# Patient Record
Sex: Female | Born: 1955 | Race: White | Hispanic: No | Marital: Married | Smoking: Never smoker
Health system: Southern US, Community
[De-identification: ages and names within clinical notes are randomized; demographics above are authoritative.]

## PROBLEM LIST (undated history)

## (undated) HISTORY — PX: ABDOMINAL HYSTERECTOMY: SHX81

---

## 1998-01-09 ENCOUNTER — Other Ambulatory Visit: Admission: RE | Admit: 1998-01-09 | Discharge: 1998-01-09 | Payer: Self-pay | Admitting: Obstetrics & Gynecology

## 1999-02-11 ENCOUNTER — Other Ambulatory Visit: Admission: RE | Admit: 1999-02-11 | Discharge: 1999-02-11 | Payer: Self-pay | Admitting: Obstetrics & Gynecology

## 2001-05-04 ENCOUNTER — Other Ambulatory Visit: Admission: RE | Admit: 2001-05-04 | Discharge: 2001-05-04 | Payer: Self-pay | Admitting: Obstetrics & Gynecology

## 2005-04-20 ENCOUNTER — Other Ambulatory Visit: Admission: RE | Admit: 2005-04-20 | Discharge: 2005-04-20 | Payer: Self-pay | Admitting: Obstetrics & Gynecology

## 2008-05-18 ENCOUNTER — Encounter: Admission: RE | Admit: 2008-05-18 | Discharge: 2008-05-18 | Payer: Self-pay | Admitting: Obstetrics & Gynecology

## 2010-08-05 ENCOUNTER — Other Ambulatory Visit: Payer: Self-pay | Admitting: Obstetrics & Gynecology

## 2010-08-05 DIAGNOSIS — R928 Other abnormal and inconclusive findings on diagnostic imaging of breast: Secondary | ICD-10-CM

## 2010-08-13 ENCOUNTER — Ambulatory Visit
Admission: RE | Admit: 2010-08-13 | Discharge: 2010-08-13 | Disposition: A | Discharge status: Home / Self Care | Payer: BC Managed Care – PPO | Source: Ambulatory Visit | Attending: Obstetrics & Gynecology | Admitting: Obstetrics & Gynecology

## 2010-08-13 DIAGNOSIS — R928 Other abnormal and inconclusive findings on diagnostic imaging of breast: Secondary | ICD-10-CM

## 2017-02-16 ENCOUNTER — Other Ambulatory Visit: Payer: Self-pay | Admitting: Obstetrics & Gynecology

## 2017-02-16 DIAGNOSIS — R928 Other abnormal and inconclusive findings on diagnostic imaging of breast: Secondary | ICD-10-CM

## 2017-02-19 ENCOUNTER — Other Ambulatory Visit: Payer: Self-pay | Admitting: Obstetrics & Gynecology

## 2017-02-19 ENCOUNTER — Ambulatory Visit
Admission: RE | Admit: 2017-02-19 | Discharge: 2017-02-19 | Disposition: A | Discharge status: Home / Self Care | Payer: BC Managed Care – PPO | Source: Ambulatory Visit | Attending: Obstetrics & Gynecology | Admitting: Obstetrics & Gynecology

## 2017-02-19 DIAGNOSIS — R928 Other abnormal and inconclusive findings on diagnostic imaging of breast: Secondary | ICD-10-CM

## 2017-02-19 DIAGNOSIS — N6320 Unspecified lump in the left breast, unspecified quadrant: Secondary | ICD-10-CM

## 2017-08-24 ENCOUNTER — Other Ambulatory Visit: Payer: BC Managed Care – PPO

## 2017-09-01 ENCOUNTER — Ambulatory Visit
Admission: RE | Admit: 2017-09-01 | Discharge: 2017-09-01 | Disposition: A | Discharge status: Home / Self Care | Payer: BC Managed Care – PPO | Source: Ambulatory Visit | Attending: Obstetrics & Gynecology | Admitting: Obstetrics & Gynecology

## 2017-09-01 ENCOUNTER — Other Ambulatory Visit: Payer: Self-pay | Admitting: Obstetrics & Gynecology

## 2017-09-01 DIAGNOSIS — N6320 Unspecified lump in the left breast, unspecified quadrant: Secondary | ICD-10-CM

## 2017-11-17 ENCOUNTER — Emergency Department (HOSPITAL_BASED_OUTPATIENT_CLINIC_OR_DEPARTMENT_OTHER): Payer: BC Managed Care – PPO

## 2017-11-17 ENCOUNTER — Encounter (HOSPITAL_BASED_OUTPATIENT_CLINIC_OR_DEPARTMENT_OTHER): Payer: Self-pay

## 2017-11-17 ENCOUNTER — Emergency Department (HOSPITAL_BASED_OUTPATIENT_CLINIC_OR_DEPARTMENT_OTHER)
Admission: EM | Admit: 2017-11-17 | Discharge: 2017-11-17 | Disposition: A | Discharge status: Home / Self Care | Payer: BC Managed Care – PPO | Attending: Emergency Medicine | Admitting: Emergency Medicine

## 2017-11-17 ENCOUNTER — Other Ambulatory Visit: Payer: Self-pay

## 2017-11-17 DIAGNOSIS — S8991XA Unspecified injury of right lower leg, initial encounter: Secondary | ICD-10-CM | POA: Diagnosis not present

## 2017-11-17 DIAGNOSIS — M25561 Pain in right knee: Secondary | ICD-10-CM

## 2017-11-17 DIAGNOSIS — S0990XA Unspecified injury of head, initial encounter: Secondary | ICD-10-CM | POA: Diagnosis not present

## 2017-11-17 DIAGNOSIS — Y998 Other external cause status: Secondary | ICD-10-CM | POA: Insufficient documentation

## 2017-11-17 DIAGNOSIS — Y92000 Kitchen of unspecified non-institutional (private) residence as  the place of occurrence of the external cause: Secondary | ICD-10-CM | POA: Insufficient documentation

## 2017-11-17 DIAGNOSIS — Y9389 Activity, other specified: Secondary | ICD-10-CM | POA: Diagnosis not present

## 2017-11-17 DIAGNOSIS — W07XXXA Fall from chair, initial encounter: Secondary | ICD-10-CM | POA: Insufficient documentation

## 2017-11-17 DIAGNOSIS — W19XXXA Unspecified fall, initial encounter: Secondary | ICD-10-CM

## 2017-11-17 MED ORDER — TETANUS-DIPHTH-ACELL PERTUSSIS 5-2.5-18.5 LF-MCG/0.5 IM SUSP
0.5000 mL | Freq: Once | INTRAMUSCULAR | Status: DC
  Filled 2017-11-17: qty 0.5

## 2017-11-17 NOTE — ED Notes (Signed)
Patient transported to CT 

## 2017-11-17 NOTE — ED Provider Notes (Signed)
[facility] EMERGENCY DEPARTMENT Provider Note   CSN: [id number] Arrival date & time: 11/17/17  1835     History   Chief Complaint Chief Complaint  Patient presents with  . Fall    HPI Kayla Valencia is a 62 y.o. female presents for evaluation of acute onset, progressively improving frontal headache secondary to injury earlier today.  She states that at around 5 PM she was attempting to reach for an object high up above by standing on a step stool and hoisting herself on top of a washing machine.  She states that she thinks that she overshot when she jumped and landed back on her right side.  She states that she struck the right side of her head on the concrete floor but denies loss of consciousness.  She notes dull frontal headache 5/10 in severity, does not radiate.  She notes some swelling to the right side of the head.  Denies vision changes, nausea, vomiting, amnesia, confusion, numbness, tingling, chest pain, shortness of breath, abdominal pain.  No photophobia or phonophobia.  Landed on her right elbow and knee notes some mild right knee pain which does not radiate.  Has been ambulatory since without difficulty.  No prodrome leading up to the fall including lightheadedness, shortness of breath, or chest pain.  She is not on blood thinners.  The history is provided by the patient.    History reviewed. No pertinent past medical history.  There are no active problems to display for this patient.   Past Surgical History:  Procedure Laterality Date  . ABDOMINAL HYSTERECTOMY       OB History   None      Home Medications    Prior to Admission medications   Not on File    Family History Family History  Problem Relation Age of Onset  . Breast cancer Maternal Aunt     Social History Social History   Tobacco Use  . Smoking status: Never Smoker  . Smokeless tobacco: Never Used  Substance Use Topics  . Alcohol use: Yes    Comment: occ  . Drug use:  Never     Allergies   Patient has no known allergies.   Review of Systems Review of Systems  Constitutional: Negative for fever.  Eyes: Negative for photophobia and visual disturbance.  Respiratory: Negative for shortness of breath.   Cardiovascular: Negative for chest pain.  Gastrointestinal: Negative for abdominal pain, nausea and vomiting.  Musculoskeletal: Positive for arthralgias. Negative for back pain and neck pain.  Skin: Positive for wound.  Neurological: Positive for headaches. Negative for syncope, weakness and numbness.  All other systems reviewed and are negative.    Physical Exam Updated Vital Signs BP 135/88 (BP Location: Left Arm)   Pulse 80   Temp 97.9 F (36.6 C) (Oral)   Resp 18   Ht 5\' 3"  (1.6 m)   Wt 64.9 kg   SpO2 97%   BMI 25.33 kg/m   Physical Exam  Constitutional: She is oriented to person, place, and time. She appears well-developed and well-nourished. No distress.  HENT:  Head: Normocephalic.  No Battle's signs, no raccoon's eyes, no rhinorrhea. No hemotympanum.  No tenderness to palpation of the face.  She has mild swelling to the right frontoparietal region with mild ecchymosis.  No crepitus noted.  No tenderness to palpation of the skull otherwise.  Eyes: Pupils are equal, round, and reactive to light. Conjunctivae and EOM are normal. Right eye exhibits no discharge.  Left eye exhibits no discharge.  Neck: Normal range of motion. Neck supple. No JVD present. No tracheal deviation present.  No midline spine TTP, no paraspinal muscle tenderness, no deformity, crepitus, or step-off noted   Cardiovascular: Normal rate, regular rhythm, normal heart sounds and intact distal pulses.  2+ radial and DP/PT pulses bilaterally, no lower extremity edema, no palpable cords, compartments are soft   Pulmonary/Chest: Effort normal and breath sounds normal. She exhibits no tenderness.  Abdominal: Soft. Bowel sounds are normal. She exhibits no distension.  There is no tenderness. There is no guarding.  Musculoskeletal: Normal range of motion. She exhibits tenderness. She exhibits no edema.  No midline spine TTP, no paraspinal muscle tenderness, no deformity, crepitus, or step-off noted.  5/5 strength of BUE and BLE major muscle groups.  Superficial abrasion noted to the anterior aspect of the right knee.  Mild tenderness to palpation to the lateral joint line of the right knee.  No ligamentous laxity or varus or valgus instability.  Moves all extremities spontaneously without difficulty.  Neurological: She is alert and oriented to person, place, and time. No cranial nerve deficit or sensory deficit. She exhibits normal muscle tone.  Mental Status:  Alert, thought content appropriate, able to give a coherent history. Speech fluent without evidence of aphasia. Able to follow 2 step commands without difficulty.  Cranial Nerves:  II:  Peripheral visual fields grossly normal, pupils equal, round, reactive to light III,IV, VI: ptosis not present, extra-ocular motions intact bilaterally  V,VII: smile symmetric, facial light touch sensation equal VIII: hearing grossly normal to voice  X: uvula elevates symmetrically  XI: bilateral shoulder shrug symmetric and strong XII: midline tongue extension without fassiculations Motor:  Normal tone. 5/5 strength of BUE and BLE major muscle groups including strong and equal grip strength and dorsiflexion/plantar flexion Sensory: light touch normal in all extremities. Cerebellar: normal finger-to-nose with bilateral upper extremities Gait: normal gait and balance. Able to walk on toes and heels with ease.     Skin: Skin is warm and dry. No erythema.  Psychiatric: She has a normal mood and affect. Her behavior is normal.  Nursing note and vitals reviewed.    ED Treatments / Results  Labs (all labs ordered are listed, but only abnormal results are displayed) Labs Reviewed - No data to  display  EKG None  Radiology Ct Head Wo Contrast  Result Date: 11/17/2017 CLINICAL DATA:  Headache after falling onto concrete. EXAM: CT HEAD WITHOUT CONTRAST TECHNIQUE: Contiguous axial images were obtained from the base of the skull through the vertex without intravenous contrast. COMPARISON:  None. FINDINGS: Brain: There is no mass, hemorrhage or extra-axial collection. The size and configuration of the ventricles and extra-axial CSF spaces are normal. The brain parenchyma is normal, without evidence of acute or chronic infarction. Vascular: No abnormal hyperdensity of the major intracranial arteries or dural venous sinuses. No intracranial atherosclerosis. Skull: Small right parietal scalp hematoma.  No skull fracture. Sinuses/Orbits: No fluid levels or advanced mucosal thickening of the visualized paranasal sinuses. No mastoid or middle ear effusion. The orbits are normal. IMPRESSION: Small right parietal scalp hematoma without skull fracture or acute intracranial abnormality. Electronically Signed   By: Deatra Robinson M.D.   On: 11/17/2017 21:27    Procedures Procedures (including critical care time)  Medications Ordered in ED Medications - No data to display   Initial Impression / Assessment and Plan / ED Course  I have reviewed the triage vital signs and the nursing  notes.  Pertinent labs & imaging results that were available during my care of the patient were reviewed by me and considered in my medical decision making (see chart for details).    Patient presents for evaluation of headache and right-sided hematoma secondary to mechanical fall earlier today.  She is afebrile, initially hypertensive with resolution on reevaluation.  She is nontoxic in appearance.  No focal neurologic deficits, normal neurologic exam.  Low risk for skull fracture, ICH, SAH however patient wishes to proceed with head CT.  She has some right knee pain on examination but declines radiographs of the knee  today.  I think this is reasonable as she is ambulatory without difficulty and weightbearing with no evidence of ligamentous injury on examination.  Her tetanus is up-to-date.  CT of the head shows a small right parietal scalp hematoma without skull fracture or acute intracranial abnormality.  She declined medications for headache today.  She is overall well-appearing and in no apparent distress.  No evidence of serious spine injury, intra-abdominal or intrathoracic injury.  Stable for discharge home with follow-up with PCP if symptoms persist.  Discussed concussion precautions.  Discussed strict ED return precautions.  Patient and patient's husband verbalized understanding of and agreement with plan and patient is stable for discharge home at this time.  Final Clinical Impressions(s) / ED Diagnoses   Final diagnoses:  Fall, initial encounter  Injury of head, initial encounter  Acute pain of right knee    ED Discharge Orders    None       Bennye AlmFawze, Yexalen Deike A, PA-C 11/18/17 0014    Benjiman CorePickering, Nathan, MD 11/20/17 2320

## 2017-11-17 NOTE — ED Triage Notes (Addendum)
Pt states she fell from 2 step stool ~5pm onto concrete-pain to right parietal with hematoma noted-no break in skin-no LOC-abrasion to bilat LE-NAD-steady gait

## 2017-11-17 NOTE — Discharge Instructions (Addendum)
Your CT scan today did not show any sign of skull fracture or brain bleed.  1. Medications: Alternate 400 mg of ibuprofen and 500-650 mg of Tylenol every 3-4 hours as needed for pain. Do not exceed 4000 mg of Tylenol daily.  Take ibuprofen with food to avoid upset stomach issues. 2. Treatment: Rest, ice on head.  Concussion precautions given - keep patient in a quiet, not simulating, dark environment. No TV, computer use, video games until headache is resolved completely. No contact sports until cleared by the physician.  Elevate, apply ice, or Ace wrap to the knee as needed for pain.  Do some gentle stretching exercises. 3. Follow Up: With primary care physician if headache or knee pain persist.  Return to the emergency department if patient becomes lethargic, begins vomiting, develops double vision, speech difficulty, joint redness or swelling, weakness, problems walking or other change in mental status.

## 2018-03-07 ENCOUNTER — Ambulatory Visit
Admission: RE | Admit: 2018-03-07 | Discharge: 2018-03-07 | Disposition: A | Discharge status: Home / Self Care | Payer: BC Managed Care – PPO | Source: Ambulatory Visit | Attending: Obstetrics & Gynecology | Admitting: Obstetrics & Gynecology

## 2018-03-07 DIAGNOSIS — N6320 Unspecified lump in the left breast, unspecified quadrant: Secondary | ICD-10-CM

## 2019-09-04 DIAGNOSIS — Z01419 Encounter for gynecological examination (general) (routine) without abnormal findings: Secondary | ICD-10-CM | POA: Diagnosis not present

## 2019-09-04 DIAGNOSIS — Z6826 Body mass index (BMI) 26.0-26.9, adult: Secondary | ICD-10-CM | POA: Diagnosis not present

## 2019-09-07 ENCOUNTER — Other Ambulatory Visit: Payer: Self-pay | Admitting: Obstetrics & Gynecology

## 2019-09-07 DIAGNOSIS — Z1231 Encounter for screening mammogram for malignant neoplasm of breast: Secondary | ICD-10-CM

## 2019-09-29 ENCOUNTER — Other Ambulatory Visit: Payer: Self-pay

## 2019-09-29 ENCOUNTER — Ambulatory Visit
Admission: RE | Admit: 2019-09-29 | Discharge: 2019-09-29 | Disposition: A | Discharge status: Home / Self Care | Payer: Medicare PPO | Source: Ambulatory Visit | Attending: Obstetrics & Gynecology | Admitting: Obstetrics & Gynecology

## 2019-09-29 DIAGNOSIS — Z1231 Encounter for screening mammogram for malignant neoplasm of breast: Secondary | ICD-10-CM | POA: Diagnosis not present

## 2019-10-03 ENCOUNTER — Other Ambulatory Visit: Payer: Self-pay | Admitting: Obstetrics & Gynecology

## 2019-10-03 DIAGNOSIS — Z8249 Family history of ischemic heart disease and other diseases of the circulatory system: Secondary | ICD-10-CM | POA: Diagnosis not present

## 2019-10-03 DIAGNOSIS — K5900 Constipation, unspecified: Secondary | ICD-10-CM | POA: Diagnosis not present

## 2019-10-03 DIAGNOSIS — R928 Other abnormal and inconclusive findings on diagnostic imaging of breast: Secondary | ICD-10-CM

## 2019-10-03 DIAGNOSIS — Z79890 Hormone replacement therapy (postmenopausal): Secondary | ICD-10-CM | POA: Diagnosis not present

## 2019-10-03 DIAGNOSIS — F329 Major depressive disorder, single episode, unspecified: Secondary | ICD-10-CM | POA: Diagnosis not present

## 2019-10-03 DIAGNOSIS — K219 Gastro-esophageal reflux disease without esophagitis: Secondary | ICD-10-CM | POA: Diagnosis not present

## 2019-10-03 DIAGNOSIS — I10 Essential (primary) hypertension: Secondary | ICD-10-CM | POA: Diagnosis not present

## 2019-10-03 DIAGNOSIS — E785 Hyperlipidemia, unspecified: Secondary | ICD-10-CM | POA: Diagnosis not present

## 2019-10-03 DIAGNOSIS — R519 Headache, unspecified: Secondary | ICD-10-CM | POA: Diagnosis not present

## 2019-10-03 DIAGNOSIS — G8929 Other chronic pain: Secondary | ICD-10-CM | POA: Diagnosis not present

## 2019-10-03 DIAGNOSIS — Z791 Long term (current) use of non-steroidal anti-inflammatories (NSAID): Secondary | ICD-10-CM | POA: Diagnosis not present

## 2019-10-09 ENCOUNTER — Other Ambulatory Visit: Payer: Self-pay

## 2019-10-09 ENCOUNTER — Ambulatory Visit
Admission: RE | Admit: 2019-10-09 | Discharge: 2019-10-09 | Disposition: A | Discharge status: Home / Self Care | Payer: Medicare PPO | Source: Ambulatory Visit | Attending: Obstetrics & Gynecology | Admitting: Obstetrics & Gynecology

## 2019-10-09 DIAGNOSIS — R928 Other abnormal and inconclusive findings on diagnostic imaging of breast: Secondary | ICD-10-CM

## 2019-10-09 DIAGNOSIS — R922 Inconclusive mammogram: Secondary | ICD-10-CM | POA: Diagnosis not present

## 2019-10-09 DIAGNOSIS — N6012 Diffuse cystic mastopathy of left breast: Secondary | ICD-10-CM | POA: Diagnosis not present

## 2019-11-01 DIAGNOSIS — H906 Mixed conductive and sensorineural hearing loss, bilateral: Secondary | ICD-10-CM | POA: Diagnosis not present

## 2019-11-01 DIAGNOSIS — Z974 Presence of external hearing-aid: Secondary | ICD-10-CM | POA: Diagnosis not present

## 2019-12-28 DIAGNOSIS — M7751 Other enthesopathy of right foot: Secondary | ICD-10-CM | POA: Diagnosis not present

## 2019-12-28 DIAGNOSIS — M7752 Other enthesopathy of left foot: Secondary | ICD-10-CM | POA: Diagnosis not present

## 2020-01-04 DIAGNOSIS — I10 Essential (primary) hypertension: Secondary | ICD-10-CM | POA: Diagnosis not present

## 2020-01-04 DIAGNOSIS — F329 Major depressive disorder, single episode, unspecified: Secondary | ICD-10-CM | POA: Diagnosis not present

## 2020-01-04 DIAGNOSIS — E7800 Pure hypercholesterolemia, unspecified: Secondary | ICD-10-CM | POA: Diagnosis not present

## 2020-02-04 DIAGNOSIS — I10 Essential (primary) hypertension: Secondary | ICD-10-CM | POA: Diagnosis not present

## 2020-02-04 DIAGNOSIS — E7800 Pure hypercholesterolemia, unspecified: Secondary | ICD-10-CM | POA: Diagnosis not present

## 2020-02-04 DIAGNOSIS — F329 Major depressive disorder, single episode, unspecified: Secondary | ICD-10-CM | POA: Diagnosis not present

## 2020-02-13 DIAGNOSIS — M7751 Other enthesopathy of right foot: Secondary | ICD-10-CM | POA: Diagnosis not present

## 2020-02-13 DIAGNOSIS — M7752 Other enthesopathy of left foot: Secondary | ICD-10-CM | POA: Diagnosis not present

## 2020-04-02 DIAGNOSIS — Z79899 Other long term (current) drug therapy: Secondary | ICD-10-CM | POA: Diagnosis not present

## 2020-04-02 DIAGNOSIS — E7800 Pure hypercholesterolemia, unspecified: Secondary | ICD-10-CM | POA: Diagnosis not present

## 2020-04-03 DIAGNOSIS — I10 Essential (primary) hypertension: Secondary | ICD-10-CM | POA: Diagnosis not present

## 2020-04-03 DIAGNOSIS — F329 Major depressive disorder, single episode, unspecified: Secondary | ICD-10-CM | POA: Diagnosis not present

## 2020-04-03 DIAGNOSIS — E7800 Pure hypercholesterolemia, unspecified: Secondary | ICD-10-CM | POA: Diagnosis not present

## 2020-04-22 DIAGNOSIS — D485 Neoplasm of uncertain behavior of skin: Secondary | ICD-10-CM | POA: Diagnosis not present

## 2020-04-22 DIAGNOSIS — C44212 Basal cell carcinoma of skin of right ear and external auricular canal: Secondary | ICD-10-CM | POA: Diagnosis not present

## 2020-04-23 DIAGNOSIS — H52223 Regular astigmatism, bilateral: Secondary | ICD-10-CM | POA: Diagnosis not present

## 2020-04-23 DIAGNOSIS — H5213 Myopia, bilateral: Secondary | ICD-10-CM | POA: Diagnosis not present

## 2020-04-23 DIAGNOSIS — H524 Presbyopia: Secondary | ICD-10-CM | POA: Diagnosis not present

## 2020-05-04 DIAGNOSIS — I10 Essential (primary) hypertension: Secondary | ICD-10-CM | POA: Diagnosis not present

## 2020-05-04 DIAGNOSIS — E7800 Pure hypercholesterolemia, unspecified: Secondary | ICD-10-CM | POA: Diagnosis not present

## 2020-05-04 DIAGNOSIS — F329 Major depressive disorder, single episode, unspecified: Secondary | ICD-10-CM | POA: Diagnosis not present

## 2020-05-28 DIAGNOSIS — C44212 Basal cell carcinoma of skin of right ear and external auricular canal: Secondary | ICD-10-CM | POA: Diagnosis not present

## 2020-06-03 DIAGNOSIS — E7800 Pure hypercholesterolemia, unspecified: Secondary | ICD-10-CM | POA: Diagnosis not present

## 2020-06-03 DIAGNOSIS — F329 Major depressive disorder, single episode, unspecified: Secondary | ICD-10-CM | POA: Diagnosis not present

## 2020-06-03 DIAGNOSIS — I10 Essential (primary) hypertension: Secondary | ICD-10-CM | POA: Diagnosis not present

## 2020-07-11 DIAGNOSIS — L578 Other skin changes due to chronic exposure to nonionizing radiation: Secondary | ICD-10-CM | POA: Diagnosis not present

## 2020-07-11 DIAGNOSIS — D239 Other benign neoplasm of skin, unspecified: Secondary | ICD-10-CM | POA: Diagnosis not present

## 2020-07-11 DIAGNOSIS — Z86018 Personal history of other benign neoplasm: Secondary | ICD-10-CM | POA: Diagnosis not present

## 2020-07-11 DIAGNOSIS — D2372 Other benign neoplasm of skin of left lower limb, including hip: Secondary | ICD-10-CM | POA: Diagnosis not present

## 2020-07-11 DIAGNOSIS — L570 Actinic keratosis: Secondary | ICD-10-CM | POA: Diagnosis not present

## 2020-07-11 DIAGNOSIS — L820 Inflamed seborrheic keratosis: Secondary | ICD-10-CM | POA: Diagnosis not present

## 2020-07-11 DIAGNOSIS — L814 Other melanin hyperpigmentation: Secondary | ICD-10-CM | POA: Diagnosis not present

## 2020-07-11 DIAGNOSIS — D225 Melanocytic nevi of trunk: Secondary | ICD-10-CM | POA: Diagnosis not present

## 2020-07-11 DIAGNOSIS — L821 Other seborrheic keratosis: Secondary | ICD-10-CM | POA: Diagnosis not present

## 2020-08-14 DIAGNOSIS — H906 Mixed conductive and sensorineural hearing loss, bilateral: Secondary | ICD-10-CM | POA: Diagnosis not present

## 2020-08-29 DIAGNOSIS — Z79899 Other long term (current) drug therapy: Secondary | ICD-10-CM | POA: Diagnosis not present

## 2020-08-29 DIAGNOSIS — E7800 Pure hypercholesterolemia, unspecified: Secondary | ICD-10-CM | POA: Diagnosis not present

## 2020-09-04 DIAGNOSIS — E7800 Pure hypercholesterolemia, unspecified: Secondary | ICD-10-CM | POA: Diagnosis not present

## 2020-09-04 DIAGNOSIS — F329 Major depressive disorder, single episode, unspecified: Secondary | ICD-10-CM | POA: Diagnosis not present

## 2020-09-04 DIAGNOSIS — Z1331 Encounter for screening for depression: Secondary | ICD-10-CM | POA: Diagnosis not present

## 2020-09-04 DIAGNOSIS — I10 Essential (primary) hypertension: Secondary | ICD-10-CM | POA: Diagnosis not present

## 2020-09-04 DIAGNOSIS — Z6826 Body mass index (BMI) 26.0-26.9, adult: Secondary | ICD-10-CM | POA: Diagnosis not present

## 2020-09-04 DIAGNOSIS — Z0000 Encounter for general adult medical examination without abnormal findings: Secondary | ICD-10-CM | POA: Diagnosis not present

## 2020-09-11 ENCOUNTER — Other Ambulatory Visit: Payer: Self-pay | Admitting: Obstetrics & Gynecology

## 2020-09-11 DIAGNOSIS — Z1231 Encounter for screening mammogram for malignant neoplasm of breast: Secondary | ICD-10-CM

## 2020-09-17 DIAGNOSIS — H903 Sensorineural hearing loss, bilateral: Secondary | ICD-10-CM | POA: Diagnosis not present

## 2020-09-26 DIAGNOSIS — M7751 Other enthesopathy of right foot: Secondary | ICD-10-CM | POA: Diagnosis not present

## 2020-09-26 DIAGNOSIS — M7752 Other enthesopathy of left foot: Secondary | ICD-10-CM | POA: Diagnosis not present

## 2020-10-14 DIAGNOSIS — Z90710 Acquired absence of both cervix and uterus: Secondary | ICD-10-CM | POA: Diagnosis not present

## 2020-10-14 DIAGNOSIS — Z1272 Encounter for screening for malignant neoplasm of vagina: Secondary | ICD-10-CM | POA: Diagnosis not present

## 2020-10-14 DIAGNOSIS — Z124 Encounter for screening for malignant neoplasm of cervix: Secondary | ICD-10-CM | POA: Diagnosis not present

## 2020-10-14 DIAGNOSIS — Z6826 Body mass index (BMI) 26.0-26.9, adult: Secondary | ICD-10-CM | POA: Diagnosis not present

## 2020-10-24 ENCOUNTER — Other Ambulatory Visit: Payer: Self-pay

## 2020-10-24 ENCOUNTER — Ambulatory Visit
Admission: RE | Admit: 2020-10-24 | Discharge: 2020-10-24 | Disposition: A | Discharge status: Home / Self Care | Payer: Medicare PPO | Source: Ambulatory Visit | Attending: Obstetrics & Gynecology | Admitting: Obstetrics & Gynecology

## 2020-10-24 DIAGNOSIS — Z1231 Encounter for screening mammogram for malignant neoplasm of breast: Secondary | ICD-10-CM

## 2020-12-04 DIAGNOSIS — F329 Major depressive disorder, single episode, unspecified: Secondary | ICD-10-CM | POA: Diagnosis not present

## 2020-12-04 DIAGNOSIS — E7800 Pure hypercholesterolemia, unspecified: Secondary | ICD-10-CM | POA: Diagnosis not present

## 2020-12-04 DIAGNOSIS — I10 Essential (primary) hypertension: Secondary | ICD-10-CM | POA: Diagnosis not present

## 2020-12-24 DIAGNOSIS — L0201 Cutaneous abscess of face: Secondary | ICD-10-CM | POA: Diagnosis not present

## 2020-12-28 DIAGNOSIS — L723 Sebaceous cyst: Secondary | ICD-10-CM | POA: Diagnosis not present

## 2020-12-28 DIAGNOSIS — L0201 Cutaneous abscess of face: Secondary | ICD-10-CM | POA: Diagnosis not present

## 2021-03-04 DIAGNOSIS — E7800 Pure hypercholesterolemia, unspecified: Secondary | ICD-10-CM | POA: Diagnosis not present

## 2021-03-04 DIAGNOSIS — I10 Essential (primary) hypertension: Secondary | ICD-10-CM | POA: Diagnosis not present

## 2021-03-04 DIAGNOSIS — F329 Major depressive disorder, single episode, unspecified: Secondary | ICD-10-CM | POA: Diagnosis not present

## 2021-06-16 DIAGNOSIS — F329 Major depressive disorder, single episode, unspecified: Secondary | ICD-10-CM | POA: Diagnosis not present

## 2021-06-16 DIAGNOSIS — Z6826 Body mass index (BMI) 26.0-26.9, adult: Secondary | ICD-10-CM | POA: Diagnosis not present

## 2021-06-16 DIAGNOSIS — I10 Essential (primary) hypertension: Secondary | ICD-10-CM | POA: Diagnosis not present

## 2021-06-16 DIAGNOSIS — E7800 Pure hypercholesterolemia, unspecified: Secondary | ICD-10-CM | POA: Diagnosis not present

## 2021-07-15 DIAGNOSIS — Z86018 Personal history of other benign neoplasm: Secondary | ICD-10-CM | POA: Diagnosis not present

## 2021-07-15 DIAGNOSIS — L814 Other melanin hyperpigmentation: Secondary | ICD-10-CM | POA: Diagnosis not present

## 2021-07-15 DIAGNOSIS — L578 Other skin changes due to chronic exposure to nonionizing radiation: Secondary | ICD-10-CM | POA: Diagnosis not present

## 2021-07-15 DIAGNOSIS — L821 Other seborrheic keratosis: Secondary | ICD-10-CM | POA: Diagnosis not present

## 2021-07-15 DIAGNOSIS — D225 Melanocytic nevi of trunk: Secondary | ICD-10-CM | POA: Diagnosis not present

## 2021-10-02 ENCOUNTER — Other Ambulatory Visit: Payer: Self-pay | Admitting: Family Medicine

## 2021-10-02 DIAGNOSIS — Z1231 Encounter for screening mammogram for malignant neoplasm of breast: Secondary | ICD-10-CM

## 2021-11-06 ENCOUNTER — Ambulatory Visit: Payer: Medicare PPO

## 2021-11-21 IMAGING — MG DIGITAL SCREENING BILAT W/ TOMO W/ CAD
6 of 10 series · 6 of 30 positions shown · non-contrast
Comparison: Previous exam(s).

CLINICAL DATA: Screening.

EXAM:
DIGITAL SCREENING BILATERAL MAMMOGRAM WITH TOMO AND CAD

[L MLO synth-2D]
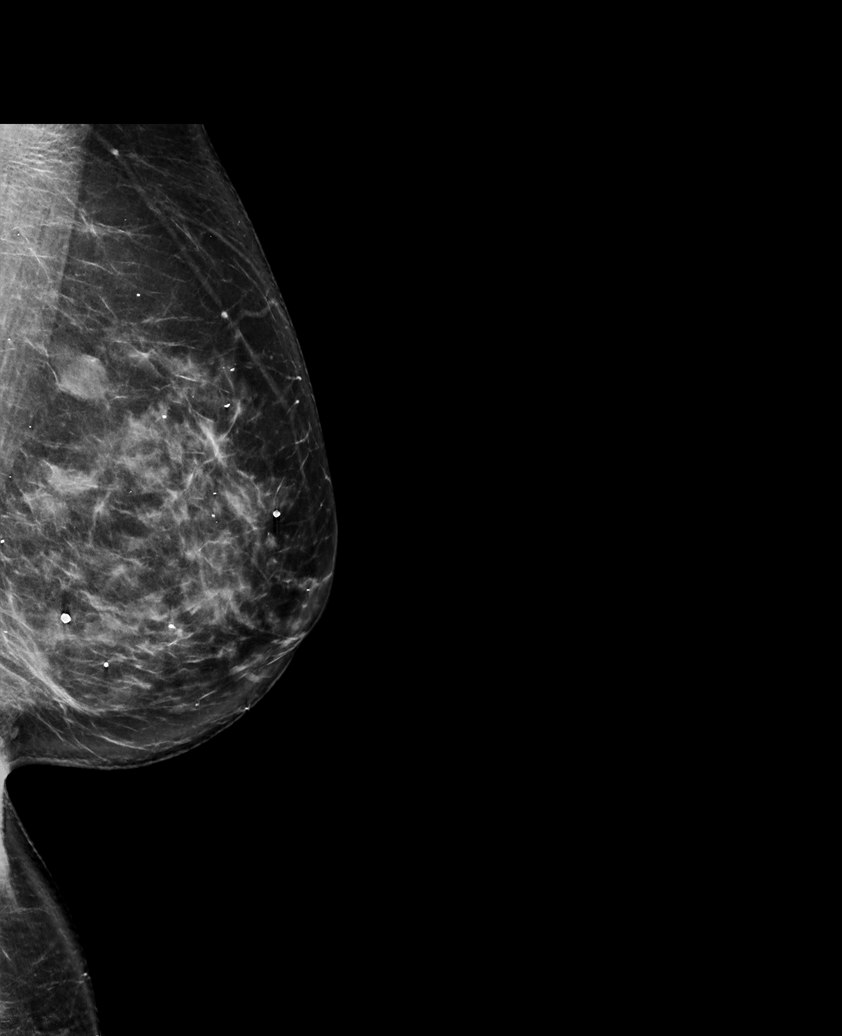

[R MLO synth-2D]
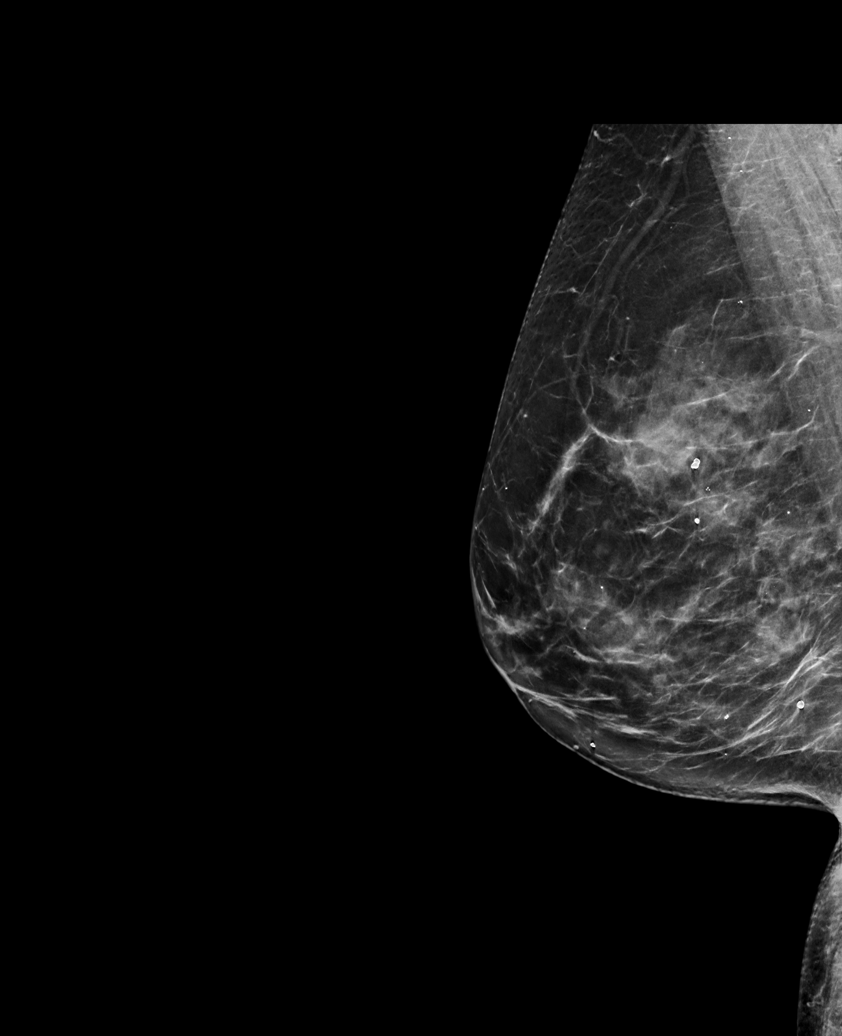

[L CC synth-2D (1 of 2)]
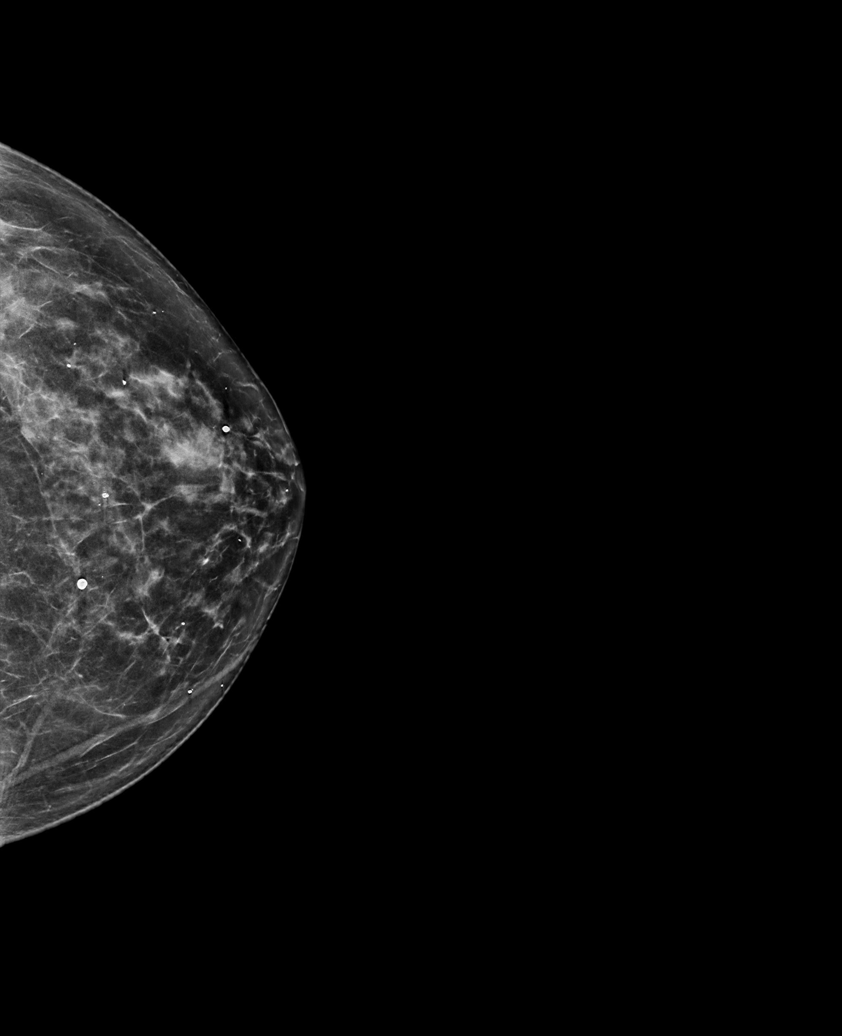

[L CC synth-2D (2 of 2)]
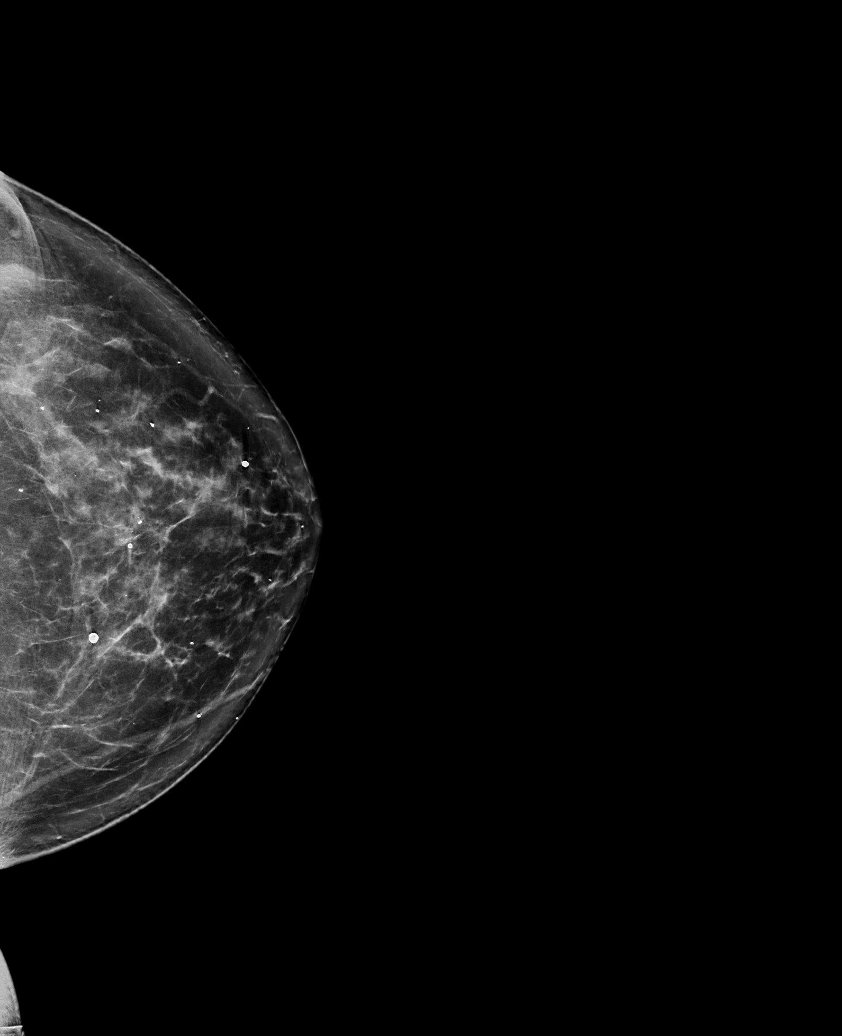

[R CC synth-2D]
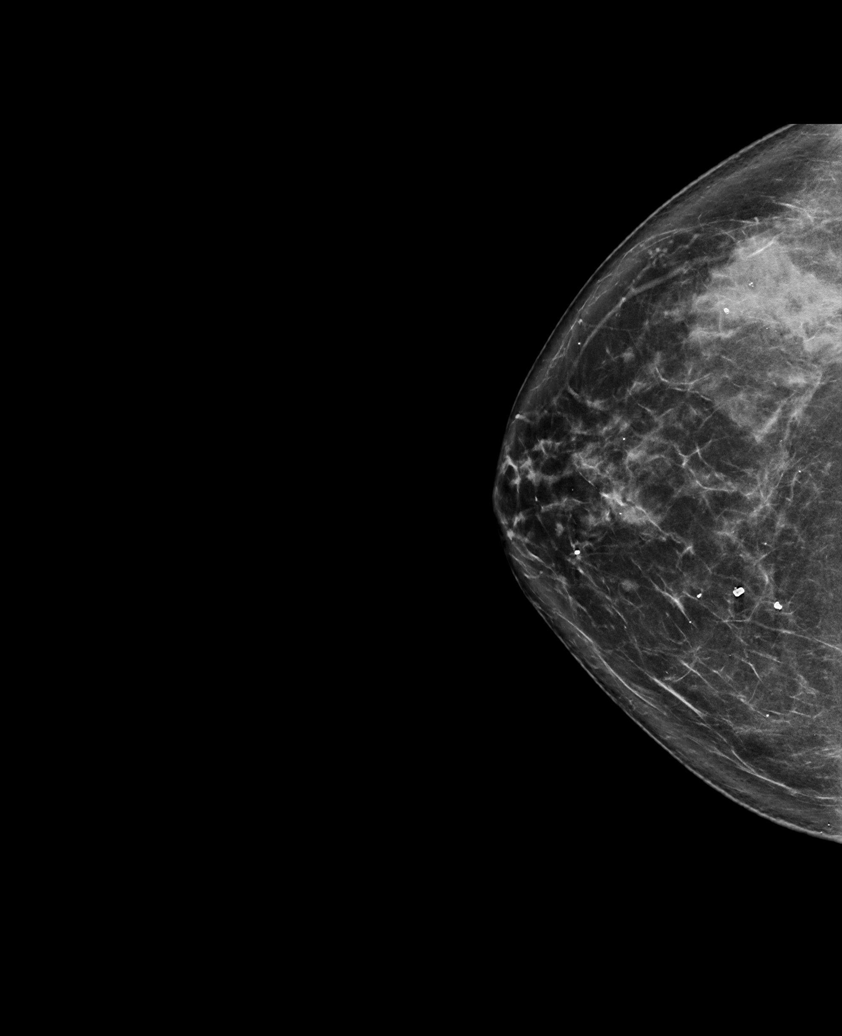

[L CC tomo · tomo slice 36/71.0]
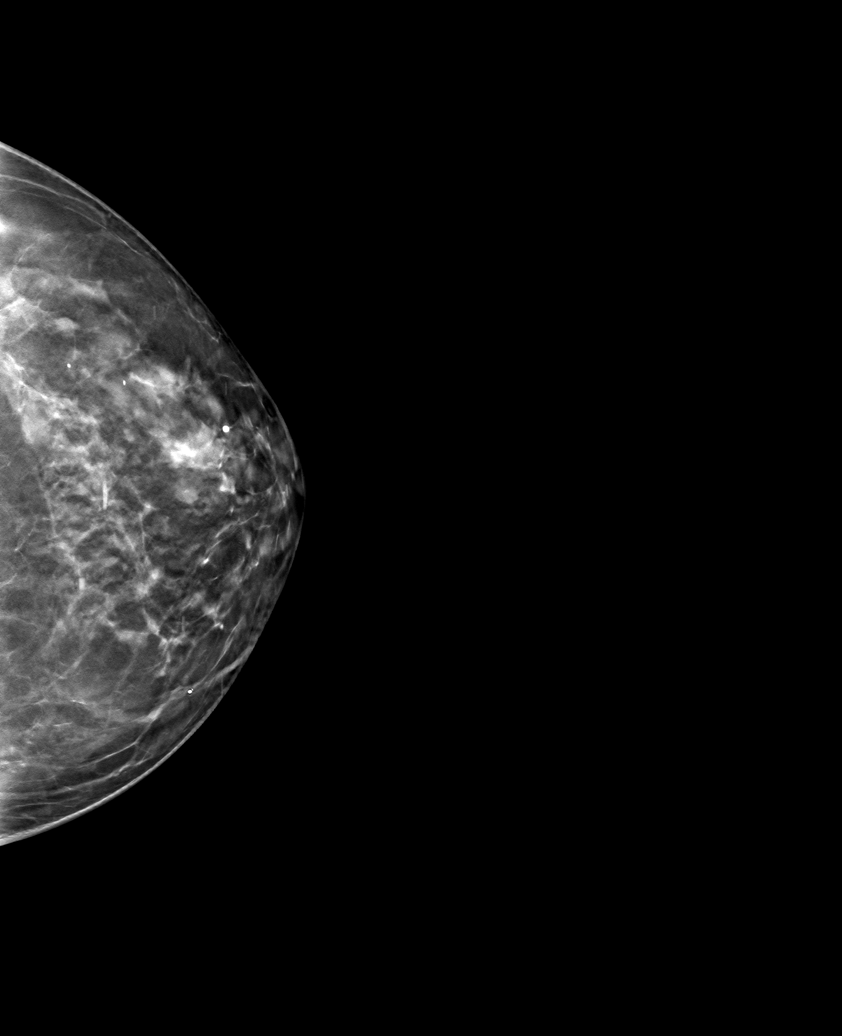

[6 of 30 positions shown; findings below may reference images not displayed]

ACR Breast Density Category c: The breast tissue is heterogeneously
dense, which may obscure small masses.
FINDINGS: In the left breast, two possible masses warrant further evaluation.
In the right breast, no findings suspicious for malignancy.

Images were processed with CAD.
IMPRESSION: Further evaluation is suggested for a possible masses in the left
breast.

RECOMMENDATION:
Diagnostic mammogram and possibly ultrasound of the left breast.
(Code:[id number]-[id number])

The patient will be contacted regarding the findings, and additional
imaging will be scheduled.

BI-RADS CATEGORY  0: Incomplete. Need additional imaging evaluation
and/or prior mammograms for comparison.

## 2021-12-16 DIAGNOSIS — Z0000 Encounter for general adult medical examination without abnormal findings: Secondary | ICD-10-CM | POA: Diagnosis not present

## 2021-12-16 DIAGNOSIS — Z6826 Body mass index (BMI) 26.0-26.9, adult: Secondary | ICD-10-CM | POA: Diagnosis not present

## 2021-12-16 DIAGNOSIS — Z1331 Encounter for screening for depression: Secondary | ICD-10-CM | POA: Diagnosis not present

## 2021-12-16 DIAGNOSIS — E7800 Pure hypercholesterolemia, unspecified: Secondary | ICD-10-CM | POA: Diagnosis not present

## 2021-12-25 ENCOUNTER — Ambulatory Visit
Admission: RE | Admit: 2021-12-25 | Discharge: 2021-12-25 | Disposition: A | Discharge status: Home / Self Care | Payer: Medicare PPO | Source: Ambulatory Visit | Attending: Family Medicine | Admitting: Family Medicine

## 2021-12-25 DIAGNOSIS — Z1231 Encounter for screening mammogram for malignant neoplasm of breast: Secondary | ICD-10-CM | POA: Diagnosis not present

## 2022-02-06 DIAGNOSIS — L309 Dermatitis, unspecified: Secondary | ICD-10-CM | POA: Diagnosis not present

## 2022-02-19 DIAGNOSIS — N958 Other specified menopausal and perimenopausal disorders: Secondary | ICD-10-CM | POA: Diagnosis not present

## 2022-02-19 DIAGNOSIS — Z6826 Body mass index (BMI) 26.0-26.9, adult: Secondary | ICD-10-CM | POA: Diagnosis not present

## 2022-02-19 DIAGNOSIS — M8588 Other specified disorders of bone density and structure, other site: Secondary | ICD-10-CM | POA: Diagnosis not present

## 2022-02-19 DIAGNOSIS — Z1151 Encounter for screening for human papillomavirus (HPV): Secondary | ICD-10-CM | POA: Diagnosis not present

## 2022-02-19 DIAGNOSIS — Z1272 Encounter for screening for malignant neoplasm of vagina: Secondary | ICD-10-CM | POA: Diagnosis not present

## 2022-02-19 DIAGNOSIS — R29890 Loss of height: Secondary | ICD-10-CM | POA: Diagnosis not present

## 2022-02-19 DIAGNOSIS — Z124 Encounter for screening for malignant neoplasm of cervix: Secondary | ICD-10-CM | POA: Diagnosis not present

## 2022-06-15 DIAGNOSIS — Z6826 Body mass index (BMI) 26.0-26.9, adult: Secondary | ICD-10-CM | POA: Diagnosis not present

## 2022-06-15 DIAGNOSIS — I10 Essential (primary) hypertension: Secondary | ICD-10-CM | POA: Diagnosis not present

## 2022-06-15 DIAGNOSIS — E7800 Pure hypercholesterolemia, unspecified: Secondary | ICD-10-CM | POA: Diagnosis not present

## 2022-06-15 DIAGNOSIS — Z79899 Other long term (current) drug therapy: Secondary | ICD-10-CM | POA: Diagnosis not present

## 2022-06-15 DIAGNOSIS — G2581 Restless legs syndrome: Secondary | ICD-10-CM | POA: Diagnosis not present

## 2022-06-15 DIAGNOSIS — F329 Major depressive disorder, single episode, unspecified: Secondary | ICD-10-CM | POA: Diagnosis not present

## 2022-08-04 DIAGNOSIS — Z86018 Personal history of other benign neoplasm: Secondary | ICD-10-CM | POA: Diagnosis not present

## 2022-08-04 DIAGNOSIS — L814 Other melanin hyperpigmentation: Secondary | ICD-10-CM | POA: Diagnosis not present

## 2022-08-04 DIAGNOSIS — L820 Inflamed seborrheic keratosis: Secondary | ICD-10-CM | POA: Diagnosis not present

## 2022-08-04 DIAGNOSIS — D225 Melanocytic nevi of trunk: Secondary | ICD-10-CM | POA: Diagnosis not present

## 2022-08-04 DIAGNOSIS — D485 Neoplasm of uncertain behavior of skin: Secondary | ICD-10-CM | POA: Diagnosis not present

## 2022-08-04 DIAGNOSIS — L821 Other seborrheic keratosis: Secondary | ICD-10-CM | POA: Diagnosis not present

## 2022-08-04 DIAGNOSIS — L570 Actinic keratosis: Secondary | ICD-10-CM | POA: Diagnosis not present

## 2022-08-04 DIAGNOSIS — L578 Other skin changes due to chronic exposure to nonionizing radiation: Secondary | ICD-10-CM | POA: Diagnosis not present

## 2022-08-04 DIAGNOSIS — L738 Other specified follicular disorders: Secondary | ICD-10-CM | POA: Diagnosis not present

## 2022-09-08 DIAGNOSIS — H906 Mixed conductive and sensorineural hearing loss, bilateral: Secondary | ICD-10-CM | POA: Diagnosis not present

## 2022-11-25 ENCOUNTER — Other Ambulatory Visit: Payer: Self-pay | Admitting: Family Medicine

## 2022-11-25 DIAGNOSIS — Z1231 Encounter for screening mammogram for malignant neoplasm of breast: Secondary | ICD-10-CM

## 2022-12-15 DIAGNOSIS — M722 Plantar fascial fibromatosis: Secondary | ICD-10-CM | POA: Diagnosis not present

## 2023-01-12 ENCOUNTER — Ambulatory Visit: Payer: Medicare PPO

## 2023-01-21 ENCOUNTER — Ambulatory Visit
Admission: RE | Admit: 2023-01-21 | Discharge: 2023-01-21 | Disposition: A | Discharge status: Home / Self Care | Payer: Medicare PPO | Source: Ambulatory Visit | Attending: Family Medicine | Admitting: Family Medicine

## 2023-01-21 DIAGNOSIS — Z1231 Encounter for screening mammogram for malignant neoplasm of breast: Secondary | ICD-10-CM

## 2023-01-26 DIAGNOSIS — M722 Plantar fascial fibromatosis: Secondary | ICD-10-CM | POA: Diagnosis not present

## 2023-02-01 DIAGNOSIS — Z1339 Encounter for screening examination for other mental health and behavioral disorders: Secondary | ICD-10-CM | POA: Diagnosis not present

## 2023-02-01 DIAGNOSIS — Z6826 Body mass index (BMI) 26.0-26.9, adult: Secondary | ICD-10-CM | POA: Diagnosis not present

## 2023-02-01 DIAGNOSIS — Z1331 Encounter for screening for depression: Secondary | ICD-10-CM | POA: Diagnosis not present

## 2023-02-01 DIAGNOSIS — Z79899 Other long term (current) drug therapy: Secondary | ICD-10-CM | POA: Diagnosis not present

## 2023-02-01 DIAGNOSIS — Z0000 Encounter for general adult medical examination without abnormal findings: Secondary | ICD-10-CM | POA: Diagnosis not present

## 2023-02-01 DIAGNOSIS — E7800 Pure hypercholesterolemia, unspecified: Secondary | ICD-10-CM | POA: Diagnosis not present

## 2023-03-17 DIAGNOSIS — H524 Presbyopia: Secondary | ICD-10-CM | POA: Diagnosis not present

## 2023-03-30 DIAGNOSIS — M722 Plantar fascial fibromatosis: Secondary | ICD-10-CM | POA: Diagnosis not present

## 2023-05-11 DIAGNOSIS — Z6826 Body mass index (BMI) 26.0-26.9, adult: Secondary | ICD-10-CM | POA: Diagnosis not present

## 2023-05-11 DIAGNOSIS — F329 Major depressive disorder, single episode, unspecified: Secondary | ICD-10-CM | POA: Diagnosis not present

## 2023-06-28 DIAGNOSIS — I10 Essential (primary) hypertension: Secondary | ICD-10-CM | POA: Diagnosis not present

## 2023-06-28 DIAGNOSIS — F329 Major depressive disorder, single episode, unspecified: Secondary | ICD-10-CM | POA: Diagnosis not present

## 2023-06-28 DIAGNOSIS — Z6825 Body mass index (BMI) 25.0-25.9, adult: Secondary | ICD-10-CM | POA: Diagnosis not present

## 2023-08-04 DIAGNOSIS — L738 Other specified follicular disorders: Secondary | ICD-10-CM | POA: Diagnosis not present

## 2023-08-04 DIAGNOSIS — L821 Other seborrheic keratosis: Secondary | ICD-10-CM | POA: Diagnosis not present

## 2023-08-04 DIAGNOSIS — L570 Actinic keratosis: Secondary | ICD-10-CM | POA: Diagnosis not present

## 2023-08-04 DIAGNOSIS — L820 Inflamed seborrheic keratosis: Secondary | ICD-10-CM | POA: Diagnosis not present

## 2023-08-04 DIAGNOSIS — D225 Melanocytic nevi of trunk: Secondary | ICD-10-CM | POA: Diagnosis not present

## 2023-08-04 DIAGNOSIS — L578 Other skin changes due to chronic exposure to nonionizing radiation: Secondary | ICD-10-CM | POA: Diagnosis not present

## 2023-08-04 DIAGNOSIS — Z86018 Personal history of other benign neoplasm: Secondary | ICD-10-CM | POA: Diagnosis not present

## 2023-08-04 DIAGNOSIS — D485 Neoplasm of uncertain behavior of skin: Secondary | ICD-10-CM | POA: Diagnosis not present

## 2023-08-04 DIAGNOSIS — L814 Other melanin hyperpigmentation: Secondary | ICD-10-CM | POA: Diagnosis not present

## 2023-09-08 DIAGNOSIS — H906 Mixed conductive and sensorineural hearing loss, bilateral: Secondary | ICD-10-CM | POA: Diagnosis not present

## 2023-10-07 DIAGNOSIS — Z6826 Body mass index (BMI) 26.0-26.9, adult: Secondary | ICD-10-CM | POA: Diagnosis not present

## 2023-10-07 DIAGNOSIS — Z01419 Encounter for gynecological examination (general) (routine) without abnormal findings: Secondary | ICD-10-CM | POA: Diagnosis not present

## 2023-10-26 DIAGNOSIS — R0981 Nasal congestion: Secondary | ICD-10-CM | POA: Diagnosis not present

## 2023-10-26 DIAGNOSIS — R051 Acute cough: Secondary | ICD-10-CM | POA: Diagnosis not present

## 2023-11-03 ENCOUNTER — Ambulatory Visit (HOSPITAL_BASED_OUTPATIENT_CLINIC_OR_DEPARTMENT_OTHER): Admission: EM | Admit: 2023-11-03 | Discharge: 2023-11-03 | Disposition: A | Discharge status: Home / Self Care

## 2023-11-03 ENCOUNTER — Encounter (HOSPITAL_BASED_OUTPATIENT_CLINIC_OR_DEPARTMENT_OTHER): Payer: Self-pay | Admitting: Emergency Medicine

## 2023-11-03 DIAGNOSIS — R051 Acute cough: Secondary | ICD-10-CM | POA: Diagnosis not present

## 2023-11-03 MED ORDER — BENZONATATE 100 MG PO CAPS: 100.0000 mg | ORAL_CAPSULE | Freq: Three times a day (TID) | ORAL | 0 refills | Status: AC

## 2023-11-03 NOTE — ED Provider Notes (Signed)
 [facility]    CSN: [id number] Arrival date & time: 11/03/23  0934      History   Chief Complaint Chief Complaint  Patient presents with   Cough    HPI Kayla Valencia is a 68 y.o. female.   Pt c/o coughing, fever, runny nose x 3 weeks ago she went on Oct 21 st to [place] and was given a kenalog shot and was prescribed a zpack and steroids. Pt still having coughing and she has a wedding to go to this weekend. She is overall feeling better just annoyed by the cough. She is not taking her allergy meds currently.     Cough   History reviewed. No pertinent past medical history.  There are no active problems to display for this patient.   Past Surgical History:  Procedure Laterality Date   ABDOMINAL HYSTERECTOMY      OB History   No obstetric history on file.      Home Medications    Prior to Admission medications   Medication Sig Start Date End Date Taking? Authorizing Provider  amLODipine (NORVASC) 5 MG tablet Take 5 mg by mouth daily. 10/25/19  Yes [provider]  benzonatate (TESSALON) 100 MG capsule Take 1 capsule (100 mg total) by mouth every 8 (eight) hours. Take 1-2 capsules every 8 hours as needed 11/03/23  Yes Ashaun Gaughan A, FNP  rosuvastatin (CRESTOR) 10 MG tablet Take 10 mg by mouth at bedtime. 01/24/20  Yes [provider]  citalopram (CELEXA) 20 MG tablet Take 20 mg by mouth daily.    [provider]    Family History Family History  Problem Relation Age of Onset   Breast cancer Maternal Aunt     Social History Social History   Tobacco Use   Smoking status: Never   Smokeless tobacco: Never  Vaping Use   Vaping status: Never Used  Substance Use Topics   Alcohol use: Yes    Comment: occ   Drug use: Never     Allergies   Patient has no known allergies.   Review of Systems Review of Systems  Respiratory:  Positive for cough.      Physical Exam Triage Vital Signs ED Triage Vitals   Encounter Vitals Group     BP 11/03/23 0947 136/79     Girls Systolic BP Percentile --      Girls Diastolic BP Percentile --      Boys Systolic BP Percentile --      Boys Diastolic BP Percentile --      Pulse Rate 11/03/23 0947 85     Resp 11/03/23 0947 18     Temp 11/03/23 0947 98.1 F (36.7 C)     Temp Source 11/03/23 0947 Oral     SpO2 11/03/23 0947 96 %     Weight --      Height --      Head Circumference --      Peak Flow --      Pain Score 11/03/23 0945 0     Pain Loc --      Pain Education --      Exclude from Growth Chart --    No data found.  Updated Vital Signs BP 136/79 (BP Location: Right Arm)   Pulse 85   Temp 98.1 F (36.7 C) (Oral)   Resp 18   SpO2 96%   Visual Acuity Right Eye Distance:   Left Eye Distance:   Bilateral Distance:  Right Eye Near:   Left Eye Near:    Bilateral Near:     Physical Exam Vitals and nursing note reviewed.  Constitutional:      General: She is not in acute distress.    Appearance: Normal appearance. She is not ill-appearing, toxic-appearing or diaphoretic.  HENT:     Nose: No congestion or rhinorrhea.  Cardiovascular:     Rate and Rhythm: Normal rate and regular rhythm.  Pulmonary:     Effort: Pulmonary effort is normal.     Breath sounds: Normal breath sounds.  Neurological:     Mental Status: She is alert.  Psychiatric:        Mood and Affect: Mood normal.      UC Treatments / Results  Labs (all labs ordered are listed, but only abnormal results are displayed) Labs Reviewed - No data to display  EKG   Radiology No results found.  Procedures Procedures (including critical care time)  Medications Ordered in UC Medications - No data to display  Initial Impression / Assessment and Plan / UC Course  I have reviewed the triage vital signs and the nursing notes.  Pertinent labs & imaging results that were available during my care of the patient were reviewed by me and considered in my medical  decision making (see chart for details).     Cough-no concerns on exam today.  Recommend start back on allergy medication.  Also prescribed Tessalon Perles to control cough.  Follow-up as needed Final Clinical Impressions(s) / UC Diagnoses   Final diagnoses:  Acute cough     Discharge Instructions      Start back on your allergy medication.  Tessalon Perles for cough as needed.  Follow-up as needed     ED Prescriptions     Medication Sig Dispense Auth. Provider   benzonatate (TESSALON) 100 MG capsule Take 1 capsule (100 mg total) by mouth every 8 (eight) hours. Take 1-2 capsules every 8 hours as needed 21 capsule Makynna Manocchio A, FNP      PDMP not reviewed this encounter.   Adah Wilbert LABOR, FNP 11/03/23 1020

## 2023-11-03 NOTE — Discharge Instructions (Signed)
 Start back on your allergy medication.  Tessalon Perles for cough as needed.  Follow-up as needed

## 2023-11-03 NOTE — ED Triage Notes (Signed)
 Pt c/o coughing, fever, runny nose x 3 weeks ago she went on Oct 21 st to [place] and was given a kenalog shot and was prescribed a zpack and steroids. Pt still having coughing and she has a wedding to go to this weekend.

## 2023-12-09 ENCOUNTER — Other Ambulatory Visit: Payer: Self-pay | Admitting: Family Medicine

## 2023-12-09 DIAGNOSIS — Z1231 Encounter for screening mammogram for malignant neoplasm of breast: Secondary | ICD-10-CM

## 2024-01-28 ENCOUNTER — Ambulatory Visit
Admission: RE | Admit: 2024-01-28 | Discharge: 2024-01-28 | Disposition: A | Source: Ambulatory Visit | Attending: Family Medicine | Admitting: Family Medicine

## 2024-01-28 DIAGNOSIS — Z1231 Encounter for screening mammogram for malignant neoplasm of breast: Secondary | ICD-10-CM
